# Patient Record
Sex: Female | Born: 1950 | Race: White | Hispanic: No | Marital: Single | Smoking: Current every day smoker
Health system: Southern US, Community
[De-identification: ages and names within clinical notes are randomized; demographics above are authoritative.]

## PROBLEM LIST (undated history)

## (undated) DIAGNOSIS — R739 Hyperglycemia, unspecified: Secondary | ICD-10-CM

## (undated) DIAGNOSIS — E7800 Pure hypercholesterolemia, unspecified: Secondary | ICD-10-CM

## (undated) HISTORY — DX: Hyperglycemia, unspecified: R73.9

## (undated) HISTORY — DX: Pure hypercholesterolemia, unspecified: E78.00

---

## 1973-06-13 HISTORY — PX: PILONIDAL CYST EXCISION: SHX744

## 1998-06-23 ENCOUNTER — Ambulatory Visit (HOSPITAL_COMMUNITY): Admission: RE | Admit: 1998-06-23 | Discharge: 1998-06-23 | Payer: Self-pay | Admitting: Obstetrics and Gynecology

## 1998-06-23 ENCOUNTER — Encounter: Payer: Self-pay | Admitting: Obstetrics and Gynecology

## 1999-06-30 ENCOUNTER — Ambulatory Visit (HOSPITAL_COMMUNITY): Admission: RE | Admit: 1999-06-30 | Discharge: 1999-06-30 | Payer: Self-pay | Admitting: Obstetrics and Gynecology

## 1999-06-30 ENCOUNTER — Encounter: Payer: Self-pay | Admitting: Obstetrics and Gynecology

## 2000-09-04 ENCOUNTER — Ambulatory Visit (HOSPITAL_COMMUNITY): Admission: RE | Admit: 2000-09-04 | Discharge: 2000-09-04 | Payer: Self-pay | Admitting: Orthopaedic Surgery

## 2000-12-27 ENCOUNTER — Encounter: Payer: Self-pay | Admitting: Obstetrics and Gynecology

## 2000-12-27 ENCOUNTER — Ambulatory Visit (HOSPITAL_COMMUNITY): Admission: RE | Admit: 2000-12-27 | Discharge: 2000-12-27 | Payer: Self-pay | Admitting: Obstetrics and Gynecology

## 2001-12-18 ENCOUNTER — Encounter: Payer: Self-pay | Admitting: Obstetrics and Gynecology

## 2001-12-18 ENCOUNTER — Ambulatory Visit (HOSPITAL_COMMUNITY): Admission: RE | Admit: 2001-12-18 | Discharge: 2001-12-18 | Payer: Self-pay | Admitting: Obstetrics and Gynecology

## 2002-12-26 ENCOUNTER — Encounter: Payer: Self-pay | Admitting: Obstetrics and Gynecology

## 2002-12-26 ENCOUNTER — Ambulatory Visit (HOSPITAL_COMMUNITY): Admission: RE | Admit: 2002-12-26 | Discharge: 2002-12-26 | Payer: Self-pay | Admitting: Obstetrics and Gynecology

## 2003-12-30 ENCOUNTER — Ambulatory Visit (HOSPITAL_COMMUNITY): Admission: RE | Admit: 2003-12-30 | Discharge: 2003-12-30 | Payer: Self-pay | Admitting: Obstetrics and Gynecology

## 2005-01-14 ENCOUNTER — Ambulatory Visit (HOSPITAL_COMMUNITY): Admission: RE | Admit: 2005-01-14 | Discharge: 2005-01-14 | Payer: Self-pay | Admitting: Obstetrics and Gynecology

## 2005-01-21 ENCOUNTER — Encounter: Admission: RE | Admit: 2005-01-21 | Discharge: 2005-01-21 | Payer: Self-pay | Admitting: Obstetrics and Gynecology

## 2006-01-25 ENCOUNTER — Encounter: Admission: RE | Admit: 2006-01-25 | Discharge: 2006-01-25 | Payer: Self-pay | Admitting: Obstetrics and Gynecology

## 2007-01-29 ENCOUNTER — Encounter: Admission: RE | Admit: 2007-01-29 | Discharge: 2007-01-29 | Payer: Self-pay | Admitting: Obstetrics and Gynecology

## 2008-02-08 ENCOUNTER — Encounter: Admission: RE | Admit: 2008-02-08 | Discharge: 2008-02-08 | Payer: Self-pay | Admitting: Obstetrics and Gynecology

## 2009-02-12 ENCOUNTER — Encounter: Admission: RE | Admit: 2009-02-12 | Discharge: 2009-02-12 | Payer: Self-pay | Admitting: Obstetrics and Gynecology

## 2010-02-16 ENCOUNTER — Encounter: Admission: RE | Admit: 2010-02-16 | Discharge: 2010-02-16 | Payer: Self-pay | Admitting: Obstetrics and Gynecology

## 2010-05-19 ENCOUNTER — Encounter
Admission: RE | Admit: 2010-05-19 | Discharge: 2010-05-19 | Payer: Self-pay | Source: Home / Self Care | Admitting: Obstetrics and Gynecology

## 2011-02-18 ENCOUNTER — Other Ambulatory Visit: Payer: Self-pay | Admitting: Gynecology

## 2011-02-18 DIAGNOSIS — Z1231 Encounter for screening mammogram for malignant neoplasm of breast: Secondary | ICD-10-CM

## 2011-03-02 ENCOUNTER — Other Ambulatory Visit: Payer: Self-pay | Admitting: Obstetrics and Gynecology

## 2011-03-02 ENCOUNTER — Ambulatory Visit
Admission: RE | Admit: 2011-03-02 | Discharge: 2011-03-02 | Disposition: A | Discharge status: Home / Self Care | Payer: BC Managed Care – PPO | Source: Ambulatory Visit | Attending: Gynecology | Admitting: Gynecology

## 2011-03-02 DIAGNOSIS — Z1231 Encounter for screening mammogram for malignant neoplasm of breast: Secondary | ICD-10-CM

## 2012-02-01 ENCOUNTER — Other Ambulatory Visit: Payer: Self-pay | Admitting: Obstetrics and Gynecology

## 2012-02-01 DIAGNOSIS — Z1231 Encounter for screening mammogram for malignant neoplasm of breast: Secondary | ICD-10-CM

## 2012-03-02 ENCOUNTER — Ambulatory Visit
Admission: RE | Admit: 2012-03-02 | Discharge: 2012-03-02 | Disposition: A | Discharge status: Home / Self Care | Payer: BC Managed Care – PPO | Source: Ambulatory Visit | Attending: Obstetrics and Gynecology | Admitting: Obstetrics and Gynecology

## 2012-03-02 DIAGNOSIS — Z1231 Encounter for screening mammogram for malignant neoplasm of breast: Secondary | ICD-10-CM

## 2012-03-06 ENCOUNTER — Other Ambulatory Visit: Payer: Self-pay | Admitting: Obstetrics and Gynecology

## 2012-03-06 DIAGNOSIS — R928 Other abnormal and inconclusive findings on diagnostic imaging of breast: Secondary | ICD-10-CM

## 2012-03-08 ENCOUNTER — Ambulatory Visit
Admission: RE | Admit: 2012-03-08 | Discharge: 2012-03-08 | Disposition: A | Discharge status: Home / Self Care | Payer: BC Managed Care – PPO | Source: Ambulatory Visit | Attending: Obstetrics and Gynecology | Admitting: Obstetrics and Gynecology

## 2012-03-08 DIAGNOSIS — R928 Other abnormal and inconclusive findings on diagnostic imaging of breast: Secondary | ICD-10-CM

## 2012-08-06 ENCOUNTER — Other Ambulatory Visit: Payer: Self-pay | Admitting: Obstetrics and Gynecology

## 2012-08-06 DIAGNOSIS — N649 Disorder of breast, unspecified: Secondary | ICD-10-CM

## 2012-08-31 ENCOUNTER — Ambulatory Visit
Admission: RE | Admit: 2012-08-31 | Discharge: 2012-08-31 | Disposition: A | Discharge status: Home / Self Care | Payer: BC Managed Care – PPO | Source: Ambulatory Visit | Attending: Obstetrics and Gynecology | Admitting: Obstetrics and Gynecology

## 2012-08-31 DIAGNOSIS — N649 Disorder of breast, unspecified: Secondary | ICD-10-CM

## 2013-01-28 ENCOUNTER — Other Ambulatory Visit: Payer: Self-pay | Admitting: Family Medicine

## 2013-01-28 DIAGNOSIS — N63 Unspecified lump in unspecified breast: Secondary | ICD-10-CM

## 2013-03-05 ENCOUNTER — Other Ambulatory Visit: Payer: Self-pay | Admitting: Family Medicine

## 2013-03-05 ENCOUNTER — Ambulatory Visit
Admission: RE | Admit: 2013-03-05 | Discharge: 2013-03-05 | Disposition: A | Discharge status: Home / Self Care | Payer: BC Managed Care – PPO | Source: Ambulatory Visit | Attending: Family Medicine | Admitting: Family Medicine

## 2013-03-05 DIAGNOSIS — N63 Unspecified lump in unspecified breast: Secondary | ICD-10-CM

## 2014-01-29 ENCOUNTER — Other Ambulatory Visit: Payer: Self-pay | Admitting: Family Medicine

## 2014-01-29 ENCOUNTER — Other Ambulatory Visit: Payer: Self-pay

## 2014-01-29 DIAGNOSIS — Z1231 Encounter for screening mammogram for malignant neoplasm of breast: Secondary | ICD-10-CM

## 2014-01-29 DIAGNOSIS — N63 Unspecified lump in unspecified breast: Secondary | ICD-10-CM

## 2014-03-06 ENCOUNTER — Encounter (INDEPENDENT_AMBULATORY_CARE_PROVIDER_SITE_OTHER): Payer: Self-pay

## 2014-03-06 ENCOUNTER — Ambulatory Visit
Admission: RE | Admit: 2014-03-06 | Discharge: 2014-03-06 | Disposition: A | Discharge status: Home / Self Care | Payer: BC Managed Care – PPO | Source: Ambulatory Visit

## 2014-03-06 DIAGNOSIS — Z1231 Encounter for screening mammogram for malignant neoplasm of breast: Secondary | ICD-10-CM

## 2014-03-06 DIAGNOSIS — N63 Unspecified lump in unspecified breast: Secondary | ICD-10-CM

## 2015-02-18 ENCOUNTER — Other Ambulatory Visit: Payer: Self-pay

## 2015-02-18 DIAGNOSIS — Z1231 Encounter for screening mammogram for malignant neoplasm of breast: Secondary | ICD-10-CM

## 2015-03-10 ENCOUNTER — Ambulatory Visit
Admission: RE | Admit: 2015-03-10 | Discharge: 2015-03-10 | Disposition: A | Discharge status: Home / Self Care | Payer: BC Managed Care – PPO | Source: Ambulatory Visit

## 2015-03-10 DIAGNOSIS — Z1231 Encounter for screening mammogram for malignant neoplasm of breast: Secondary | ICD-10-CM

## 2015-10-19 ENCOUNTER — Encounter: Payer: Self-pay | Admitting: Cardiology

## 2015-10-19 ENCOUNTER — Ambulatory Visit: Payer: BC Managed Care – PPO | Admitting: Cardiology

## 2015-10-19 ENCOUNTER — Ambulatory Visit (INDEPENDENT_AMBULATORY_CARE_PROVIDER_SITE_OTHER): Payer: BC Managed Care – PPO | Admitting: Cardiology

## 2015-10-19 VITALS — BP 156/82 | HR 98 | Ht 66.0 in | Wt 168.0 lb

## 2015-10-19 DIAGNOSIS — R011 Cardiac murmur, unspecified: Secondary | ICD-10-CM | POA: Insufficient documentation

## 2015-10-19 NOTE — Progress Notes (Signed)
Cardiology Office Note   Date:  10/19/2015   ID:  Heather CocaBetty A Bailey, DOB [birth year], MRN [id number]  PCP:  Lilia ArgueKAPLAN,KRISTEN, PA-C  Cardiologist:   Keylen Eckenrode [place], MD   Chief Complaint  Patient presents with  . New Evaluation    no chest pain, no shortness of breath, no edema, no pain or cramping in legs, no lightheadedness or dizziness, has fatigue      History of Present Illness: Heather Cabrera is a 65 y.o. female who presents for evaluation of an abnormal heart sound. She has a history of mild hypercholesterolemia. She is a smoker currently smoking 3-10 cigs/day. She had a history of elevated blood sugar in the past but this improved when she lost 20 lbs. She was seen recently for a physical exam. It was noted that she had a click/murmur. She denies any prior cardiac history. She denies any SOB, chest pain, palpitations, or edema. She stays active working in her garden.     Past Medical History  Diagnosis Date  . Hypercholesterolemia   . Elevated blood sugar     Past Surgical History  Procedure Laterality Date  . Pilonidal cyst excision  1975  . Cesarean section       Current Outpatient Prescriptions  Medication Sig Dispense Refill  . Ascorbic Acid (VITAMIN C) 100 MG tablet Take 100 mg by mouth daily.    . Cholecalciferol (VITAMIN D) 2000 units tablet Take 2,000 Units by mouth daily.    Marland Kitchen. doxycycline (VIBRAMYCIN) 100 MG capsule Take 100 mg by mouth 2 (two) times daily.  5  . loratadine-pseudoephedrine (LORATADINE-D 24HR) 10-240 MG 24 hr tablet Take 1 tablet by mouth daily.    . Multiple Vitamins-Minerals (MULTIVITAMIN WITH MINERALS) tablet Take 1 tablet by mouth daily.     No current facility-administered medications for this visit.    Allergies:   Penicillins; Levofloxacin; and Statins    Social History:  The patient  reports that she has been smoking.  She has never used smokeless tobacco.   Family History:  The patient's family history includes CAD in her  father; Cancer in her mother; Congestive Heart Failure in her father.    ROS:  Please see the history of present illness.   Otherwise, review of systems are positive for none.   All other systems are reviewed and negative.    PHYSICAL EXAM: VS:  BP 156/82 mmHg  Pulse 98  Ht 5\' 6"  (1.676 m)  Wt 76.204 kg (168 lb)  BMI 27.13 kg/m2 , BMI Body mass index is 27.13 kg/(m^2). GEN: Well nourished, well developed, in no acute distress HEENT: normal Neck: no JVD, carotid bruits, or masses Cardiac: RRR; Normal S1-2. There is a midsystolic click noted at the LSB with a faint systolic murmur.  Respiratory:  clear to auscultation bilaterally, normal work of breathing GI: soft, nontender, nondistended, + BS MS: no deformity or atrophy Skin: warm and dry, no rash Neuro:  Strength and sensation are intact Psych: euthymic mood, full affect   EKG:  EKG is ordered today. The ekg ordered today demonstrates NSR with rate 99. Normal. I have personally reviewed and interpreted this study.    Recent Labs: No results found for requested labs within last 365 days.    Lipid Panel No results found for: CHOL, TRIG, HDL, CHOLHDL, VLDL, LDLCALC, LDLDIRECT    Wt Readings from Last 3 Encounters:  10/19/15 76.204 kg (168 lb)      Other studies Reviewed: Additional studies/ records  that were reviewed today include: Labs from primary care.  Review of the above records demonstrates: A1c 5.8%. Cholesterol 193, triglycerides 71, HDL 49, LDL 144. Glucose 112. Other chemistries are normal. CBC is normal.    ASSESSMENT AND PLAN:  1.  Cardiac click/murmur. Suspect Mitral valve prolapse on exam. She is asymptomatic. No prior cardiac history. Recommend Echo to assess valves and long term risk.  2. Mild hypercholesterolemia. Goal LDL < 125. Start with dietary modification  3. Tobacco use. Encourage smoking cessation.    Current medicines are reviewed at length with the patient today.  The patient does not  have concerns regarding medicines.  The following changes have been made:  no change  Labs/ tests ordered today include:   Orders Placed This Encounter  Procedures  . EKG 12-Lead  . ECHOCARDIOGRAM COMPLETE     Disposition:   FU TBD based on Echo results.   Signed, Denarius Sesler [place], MD  10/19/2015 8:16 PM    [facility] [address] [place], [place], [address] Phone [phone], Fax [phone]

## 2015-10-19 NOTE — Patient Instructions (Signed)
We will schedule you for an Echocardiogram   

## 2015-11-04 ENCOUNTER — Other Ambulatory Visit: Payer: Self-pay

## 2015-11-04 ENCOUNTER — Ambulatory Visit (HOSPITAL_COMMUNITY): Payer: BC Managed Care – PPO | Attending: Cardiovascular Disease

## 2015-11-04 DIAGNOSIS — Z720 Tobacco use: Secondary | ICD-10-CM | POA: Diagnosis not present

## 2015-11-04 DIAGNOSIS — R011 Cardiac murmur, unspecified: Secondary | ICD-10-CM | POA: Diagnosis not present

## 2015-11-04 DIAGNOSIS — E785 Hyperlipidemia, unspecified: Secondary | ICD-10-CM | POA: Insufficient documentation

## 2016-02-09 ENCOUNTER — Other Ambulatory Visit: Payer: Self-pay | Admitting: Family Medicine

## 2016-02-09 DIAGNOSIS — Z1231 Encounter for screening mammogram for malignant neoplasm of breast: Secondary | ICD-10-CM

## 2016-03-11 ENCOUNTER — Ambulatory Visit
Admission: RE | Admit: 2016-03-11 | Discharge: 2016-03-11 | Disposition: A | Discharge status: Home / Self Care | Payer: BC Managed Care – PPO | Source: Ambulatory Visit | Attending: Family Medicine | Admitting: Family Medicine

## 2016-03-11 DIAGNOSIS — Z1231 Encounter for screening mammogram for malignant neoplasm of breast: Secondary | ICD-10-CM

## 2017-02-01 ENCOUNTER — Other Ambulatory Visit: Payer: Self-pay | Admitting: Family Medicine

## 2017-02-01 DIAGNOSIS — Z1231 Encounter for screening mammogram for malignant neoplasm of breast: Secondary | ICD-10-CM

## 2017-03-13 ENCOUNTER — Ambulatory Visit
Admission: RE | Admit: 2017-03-13 | Discharge: 2017-03-13 | Disposition: A | Discharge status: Home / Self Care | Payer: Medicare Other | Source: Ambulatory Visit | Attending: Family Medicine | Admitting: Family Medicine

## 2017-03-13 DIAGNOSIS — Z1231 Encounter for screening mammogram for malignant neoplasm of breast: Secondary | ICD-10-CM

## 2018-02-06 ENCOUNTER — Other Ambulatory Visit: Payer: Self-pay | Admitting: Family Medicine

## 2018-02-06 DIAGNOSIS — Z1231 Encounter for screening mammogram for malignant neoplasm of breast: Secondary | ICD-10-CM

## 2018-03-16 ENCOUNTER — Ambulatory Visit
Admission: RE | Admit: 2018-03-16 | Discharge: 2018-03-16 | Disposition: A | Discharge status: Home / Self Care | Payer: Medicare Other | Source: Ambulatory Visit | Attending: Family Medicine | Admitting: Family Medicine

## 2018-03-16 DIAGNOSIS — Z1231 Encounter for screening mammogram for malignant neoplasm of breast: Secondary | ICD-10-CM

## 2019-02-21 ENCOUNTER — Other Ambulatory Visit: Payer: Self-pay | Admitting: Family Medicine

## 2019-02-21 DIAGNOSIS — Z1231 Encounter for screening mammogram for malignant neoplasm of breast: Secondary | ICD-10-CM

## 2019-04-04 ENCOUNTER — Other Ambulatory Visit: Payer: Self-pay

## 2019-04-04 ENCOUNTER — Ambulatory Visit
Admission: RE | Admit: 2019-04-04 | Discharge: 2019-04-04 | Disposition: A | Discharge status: Home / Self Care | Payer: Medicare Other | Source: Ambulatory Visit | Attending: Family Medicine | Admitting: Family Medicine

## 2019-04-04 DIAGNOSIS — Z1231 Encounter for screening mammogram for malignant neoplasm of breast: Secondary | ICD-10-CM

## 2019-07-20 ENCOUNTER — Ambulatory Visit: Payer: Medicare PPO | Attending: Internal Medicine

## 2019-07-20 DIAGNOSIS — Z23 Encounter for immunization: Secondary | ICD-10-CM | POA: Insufficient documentation

## 2019-07-20 NOTE — Progress Notes (Signed)
   Covid-19 Vaccination Clinic  Name:  Heather Cabrera    MRN: [id number] DOB: [birth year]  07/20/2019  [name] was observed post Covid-19 immunization for 15 minutes without incidence. She was provided with Vaccine Information Sheet and instruction to access the V-Safe system.   [name] was instructed to call 911 with any severe reactions post vaccine: Marland Kitchen Difficulty breathing  . Swelling of your face and throat  . A fast heartbeat  . A bad rash all over your body  . Dizziness and weakness    Immunizations Administered    Name Date Dose VIS Date Route   Moderna COVID-19 Vaccine 07/20/2019 12:14 PM 0.5 mL 05/14/2019 Intramuscular   Manufacturer: Moderna   Lot: [id number]   NDC: 01751-025-85

## 2019-08-20 ENCOUNTER — Ambulatory Visit: Payer: Medicare PPO | Attending: Internal Medicine

## 2019-08-20 DIAGNOSIS — Z23 Encounter for immunization: Secondary | ICD-10-CM

## 2019-08-20 NOTE — Progress Notes (Signed)
   Covid-19 Vaccination Clinic  Name:  Tylene Quashie    MRN: [id number] DOB: May 18, 1951  08/20/2019  Ms. George was observed post Covid-19 immunization for 15 minutes without incident. She was provided with Vaccine Information Sheet and instruction to access the V-Safe system.   Ms. Hogston was instructed to call 911 with any severe reactions post vaccine: Marland Kitchen Difficulty breathing  . Swelling of face and throat  . A fast heartbeat  . A bad rash all over body  . Dizziness and weakness   Immunizations Administered    Name Date Dose VIS Date Route   Moderna COVID-19 Vaccine 08/20/2019 12:11 PM 0.5 mL 05/14/2019 Intramuscular   Manufacturer: Moderna   Lot: [id number]   NDC: 99833-825-05

## 2020-02-20 ENCOUNTER — Other Ambulatory Visit: Payer: Self-pay | Admitting: Family Medicine

## 2020-02-20 DIAGNOSIS — Z1231 Encounter for screening mammogram for malignant neoplasm of breast: Secondary | ICD-10-CM

## 2020-04-08 ENCOUNTER — Ambulatory Visit: Payer: Medicare PPO

## 2020-04-09 ENCOUNTER — Ambulatory Visit
Admission: RE | Admit: 2020-04-09 | Discharge: 2020-04-09 | Disposition: A | Discharge status: Home / Self Care | Payer: Medicare PPO | Source: Ambulatory Visit | Attending: Family Medicine | Admitting: Family Medicine

## 2020-04-09 ENCOUNTER — Other Ambulatory Visit: Payer: Self-pay

## 2020-04-09 DIAGNOSIS — Z1231 Encounter for screening mammogram for malignant neoplasm of breast: Secondary | ICD-10-CM

## 2021-02-25 ENCOUNTER — Other Ambulatory Visit: Payer: Self-pay | Admitting: Family Medicine

## 2021-02-25 DIAGNOSIS — Z1231 Encounter for screening mammogram for malignant neoplasm of breast: Secondary | ICD-10-CM

## 2021-04-13 ENCOUNTER — Ambulatory Visit
Admission: RE | Admit: 2021-04-13 | Discharge: 2021-04-13 | Disposition: A | Discharge status: Home / Self Care | Payer: Medicare PPO | Source: Ambulatory Visit

## 2021-04-13 ENCOUNTER — Other Ambulatory Visit: Payer: Self-pay

## 2021-04-13 DIAGNOSIS — Z1231 Encounter for screening mammogram for malignant neoplasm of breast: Secondary | ICD-10-CM

## 2022-01-11 ENCOUNTER — Other Ambulatory Visit: Payer: Self-pay | Admitting: Family Medicine

## 2022-01-11 DIAGNOSIS — E2839 Other primary ovarian failure: Secondary | ICD-10-CM

## 2022-01-13 ENCOUNTER — Other Ambulatory Visit: Payer: Self-pay | Admitting: Family Medicine

## 2022-01-13 DIAGNOSIS — S22080A Wedge compression fracture of T11-T12 vertebra, initial encounter for closed fracture: Secondary | ICD-10-CM

## 2022-01-23 ENCOUNTER — Ambulatory Visit
Admission: RE | Admit: 2022-01-23 | Discharge: 2022-01-23 | Disposition: A | Discharge status: Home / Self Care | Payer: Medicare PPO | Source: Ambulatory Visit | Attending: Family Medicine | Admitting: Family Medicine

## 2022-01-23 DIAGNOSIS — S22080A Wedge compression fracture of T11-T12 vertebra, initial encounter for closed fracture: Secondary | ICD-10-CM

## 2022-03-20 IMAGING — MG MM DIGITAL SCREENING BILAT W/ TOMO AND CAD
8 series · 8 of 24 positions shown · non-contrast
Comparison: Previous exam(s).

CLINICAL DATA: Screening.

EXAM:
DIGITAL SCREENING BILATERAL MAMMOGRAM WITH TOMOSYNTHESIS AND CAD
TECHNIQUE: Bilateral screening digital craniocaudal and mediolateral oblique
mammograms were obtained. Bilateral screening digital breast
tomosynthesis was performed. The images were evaluated with
computer-aided detection.

[L CC synth-2D]
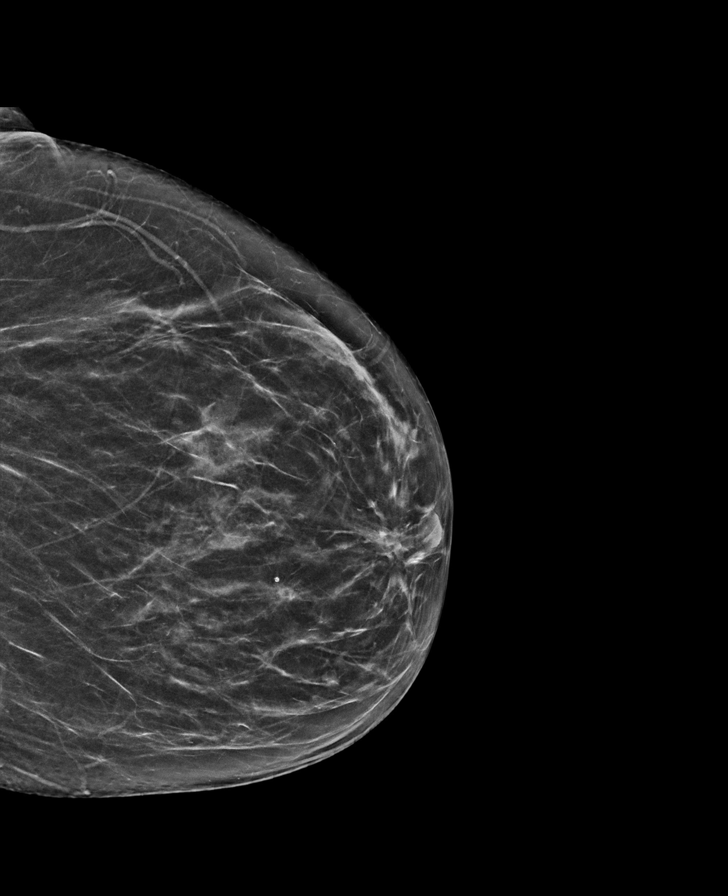

[R CC synth-2D]
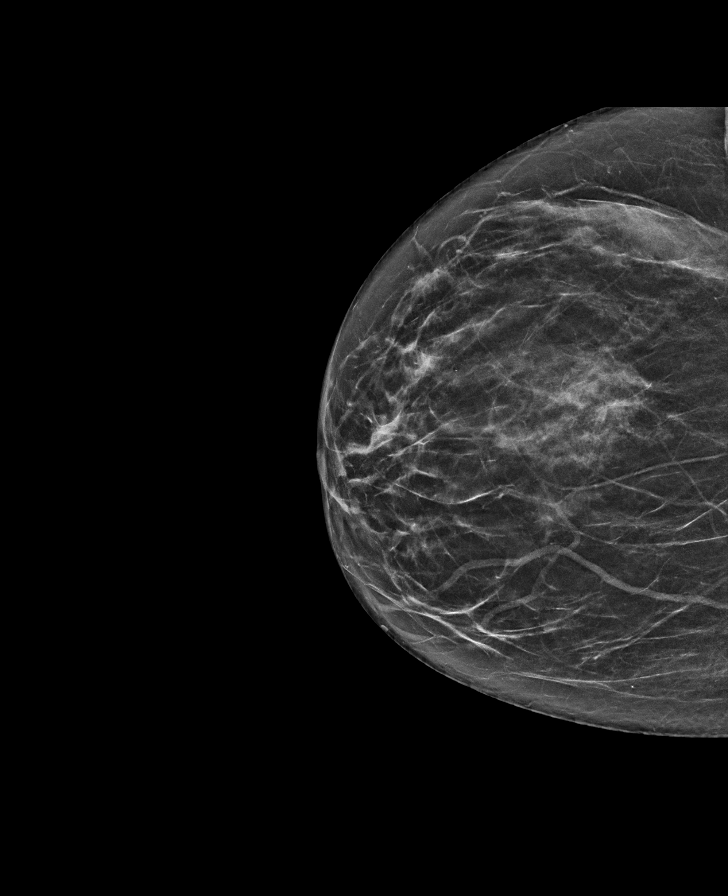

[R MLO synth-2D]
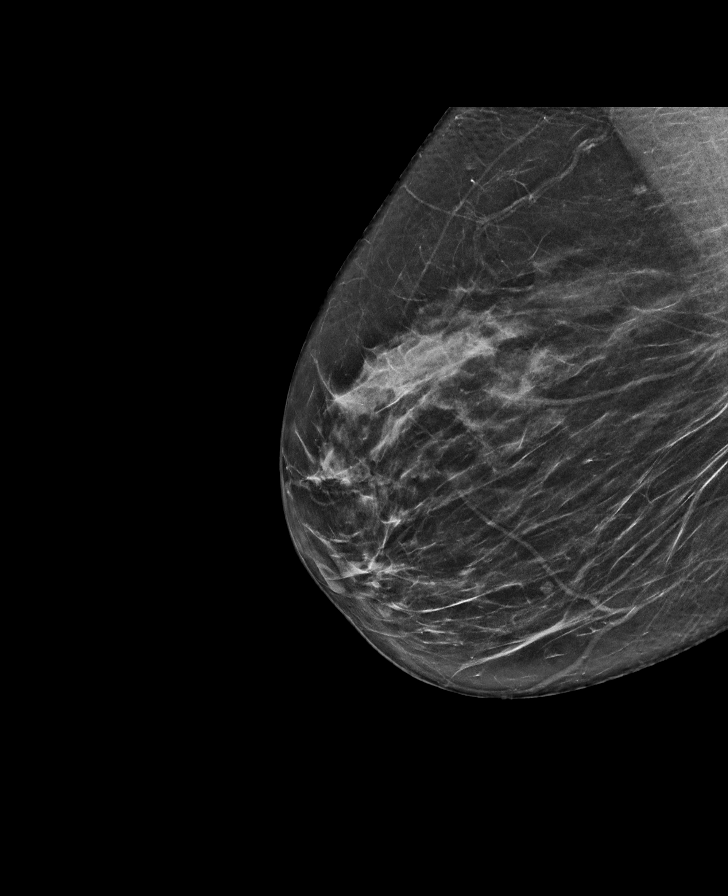

[L MLO synth-2D]
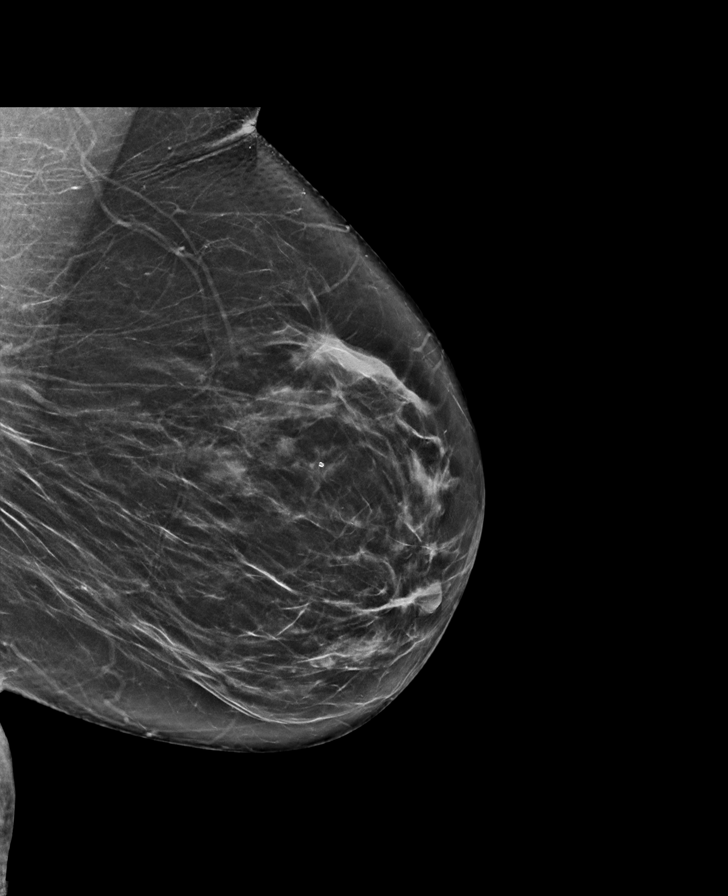

[R MLO tomo · tomo slice 33/66.0]
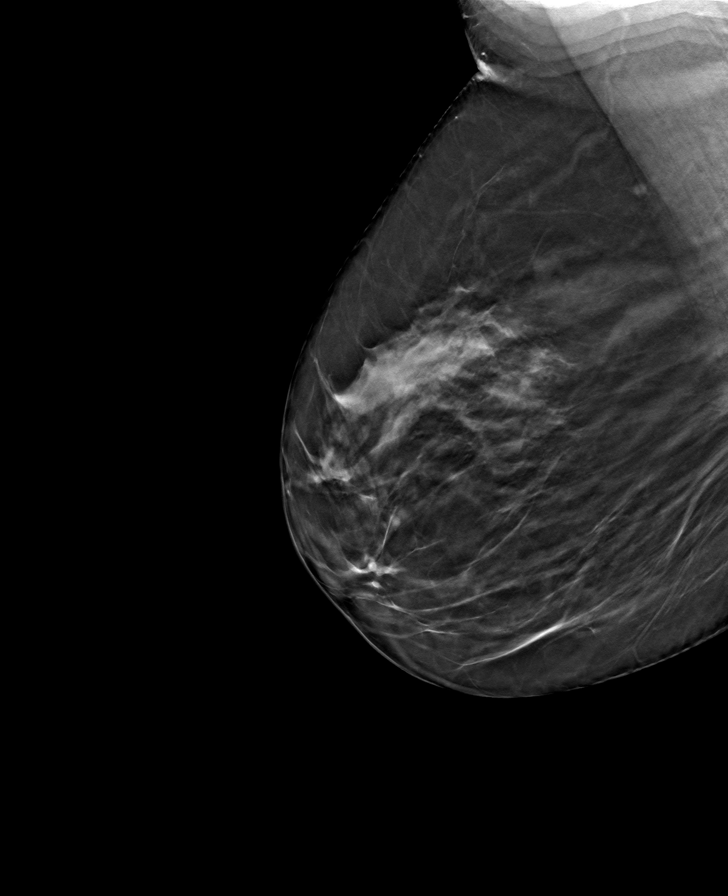

[L MLO tomo · tomo slice 36/71.0]
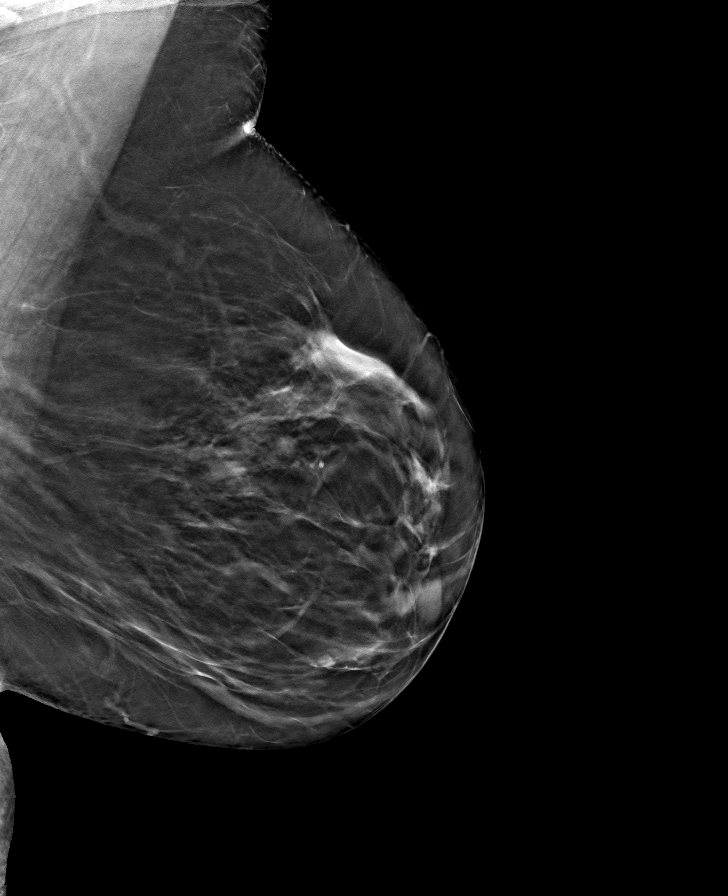

[R CC tomo · tomo slice 30/59.0]
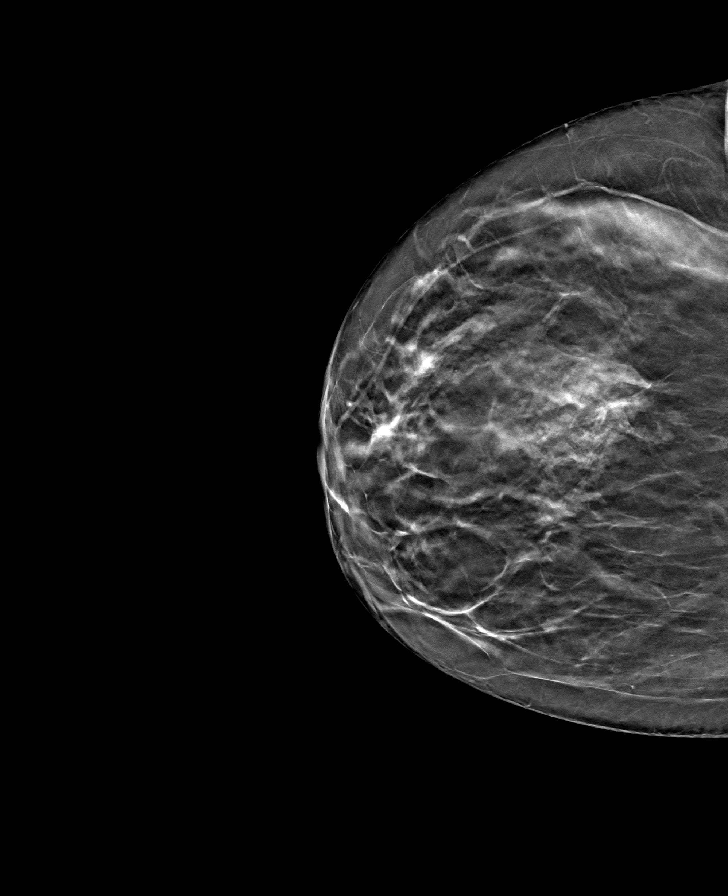

[L CC tomo · tomo slice 32/63.0]
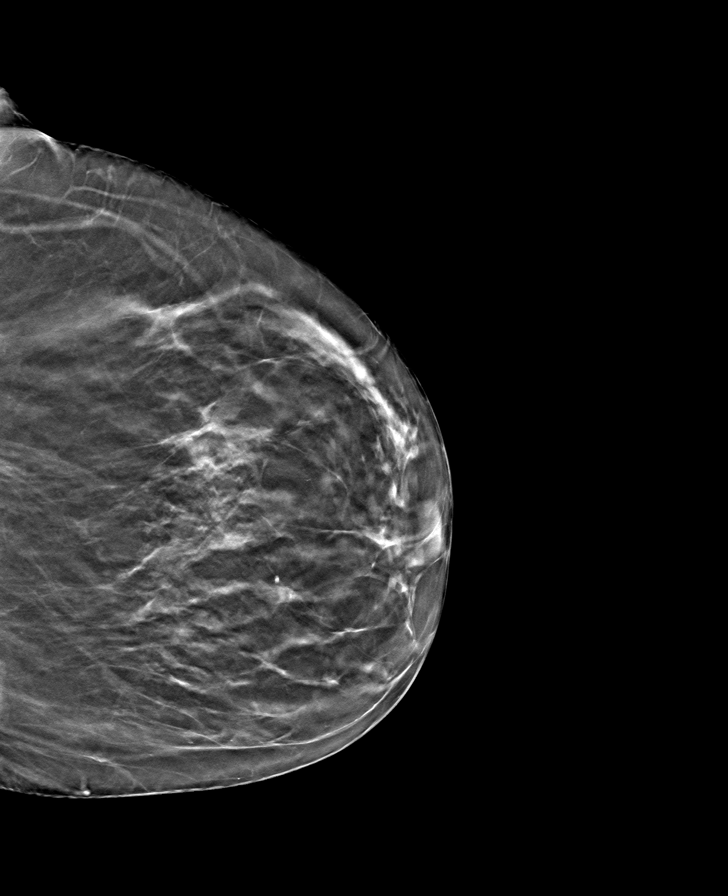

[8 of 24 positions shown; findings below may reference images not displayed]

ACR Breast Density Category b: There are scattered areas of
fibroglandular density.
FINDINGS: There are no findings suspicious for malignancy.
IMPRESSION: No mammographic evidence of malignancy. A result letter of this
screening mammogram will be mailed directly to the patient.

RECOMMENDATION:
Screening mammogram in one year. (Code:51-O-LD2)

BI-RADS CATEGORY  1: Negative.

## 2022-04-13 ENCOUNTER — Other Ambulatory Visit (HOSPITAL_BASED_OUTPATIENT_CLINIC_OR_DEPARTMENT_OTHER): Payer: Self-pay

## 2022-04-13 DIAGNOSIS — Z1231 Encounter for screening mammogram for malignant neoplasm of breast: Secondary | ICD-10-CM

## 2022-04-14 ENCOUNTER — Other Ambulatory Visit (HOSPITAL_BASED_OUTPATIENT_CLINIC_OR_DEPARTMENT_OTHER): Payer: Self-pay | Admitting: Family Medicine

## 2022-04-14 DIAGNOSIS — Z1231 Encounter for screening mammogram for malignant neoplasm of breast: Secondary | ICD-10-CM

## 2022-04-15 ENCOUNTER — Inpatient Hospital Stay (HOSPITAL_BASED_OUTPATIENT_CLINIC_OR_DEPARTMENT_OTHER): Admission: RE | Admit: 2022-04-15 | Payer: Medicare PPO | Source: Ambulatory Visit | Admitting: Radiology

## 2022-04-15 DIAGNOSIS — Z1231 Encounter for screening mammogram for malignant neoplasm of breast: Secondary | ICD-10-CM

## 2022-04-18 ENCOUNTER — Ambulatory Visit (HOSPITAL_BASED_OUTPATIENT_CLINIC_OR_DEPARTMENT_OTHER)
Admission: RE | Admit: 2022-04-18 | Discharge: 2022-04-18 | Disposition: A | Discharge status: Home / Self Care | Payer: Medicare PPO | Source: Ambulatory Visit | Attending: Family Medicine | Admitting: Family Medicine

## 2022-04-18 DIAGNOSIS — Z1231 Encounter for screening mammogram for malignant neoplasm of breast: Secondary | ICD-10-CM | POA: Insufficient documentation

## 2022-07-06 ENCOUNTER — Ambulatory Visit
Admission: RE | Admit: 2022-07-06 | Discharge: 2022-07-06 | Disposition: A | Discharge status: Home / Self Care | Payer: Medicare PPO | Source: Ambulatory Visit | Attending: Family Medicine | Admitting: Family Medicine

## 2022-07-06 DIAGNOSIS — E2839 Other primary ovarian failure: Secondary | ICD-10-CM

## 2023-04-20 ENCOUNTER — Ambulatory Visit (HOSPITAL_BASED_OUTPATIENT_CLINIC_OR_DEPARTMENT_OTHER)
Admission: RE | Admit: 2023-04-20 | Discharge: 2023-04-20 | Disposition: A | Discharge status: Home / Self Care | Payer: Medicare PPO | Source: Ambulatory Visit

## 2023-04-20 ENCOUNTER — Encounter (HOSPITAL_BASED_OUTPATIENT_CLINIC_OR_DEPARTMENT_OTHER): Payer: Self-pay | Admitting: Radiology

## 2023-04-20 DIAGNOSIS — Z1231 Encounter for screening mammogram for malignant neoplasm of breast: Secondary | ICD-10-CM | POA: Diagnosis present

## 2023-04-20 DIAGNOSIS — R92323 Mammographic fibroglandular density, bilateral breasts: Secondary | ICD-10-CM | POA: Diagnosis not present

## 2024-05-28 ENCOUNTER — Encounter (HOSPITAL_BASED_OUTPATIENT_CLINIC_OR_DEPARTMENT_OTHER): Payer: Self-pay | Admitting: Radiology

## 2024-05-28 ENCOUNTER — Other Ambulatory Visit (HOSPITAL_BASED_OUTPATIENT_CLINIC_OR_DEPARTMENT_OTHER): Payer: Self-pay | Admitting: Family Medicine

## 2024-05-28 ENCOUNTER — Inpatient Hospital Stay (HOSPITAL_BASED_OUTPATIENT_CLINIC_OR_DEPARTMENT_OTHER): Admission: RE | Admit: 2024-05-28 | Discharge: 2024-05-28

## 2024-05-28 DIAGNOSIS — Z1231 Encounter for screening mammogram for malignant neoplasm of breast: Secondary | ICD-10-CM
